# Patient Record
Sex: Male | Born: 1995
Health system: Southern US, Community
[De-identification: ages and names within clinical notes are randomized; demographics above are authoritative.]

## PROBLEM LIST (undated history)

## (undated) DIAGNOSIS — R569 Unspecified convulsions: Secondary | ICD-10-CM

## (undated) DIAGNOSIS — Z789 Other specified health status: Secondary | ICD-10-CM

## (undated) DIAGNOSIS — Z720 Tobacco use: Secondary | ICD-10-CM

## (undated) DIAGNOSIS — F419 Anxiety disorder, unspecified: Secondary | ICD-10-CM

## (undated) HISTORY — DX: Unspecified convulsions: R56.9

## (undated) HISTORY — DX: Anxiety disorder, unspecified: F41.9

## (undated) HISTORY — DX: Other specified health status: Z78.9

## (undated) HISTORY — DX: Tobacco use: Z72.0

---
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
## 2018-04-03 ENCOUNTER — Encounter: Payer: BLUE CROSS/BLUE SHIELD | Admitting: Family Medicine

## 2018-05-15 ENCOUNTER — Encounter: Payer: BLUE CROSS/BLUE SHIELD | Admitting: Family Medicine

## 2018-06-08 ENCOUNTER — Other Ambulatory Visit: Payer: Self-pay

## 2018-06-08 ENCOUNTER — Encounter (HOSPITAL_COMMUNITY): Payer: Self-pay

## 2018-06-08 ENCOUNTER — Emergency Department (HOSPITAL_COMMUNITY)
Admission: EM | Admit: 2018-06-08 | Discharge: 2018-06-08 | Disposition: A | Discharge status: Home / Self Care | Payer: 59 | Attending: Emergency Medicine | Admitting: Emergency Medicine

## 2018-06-08 DIAGNOSIS — R55 Syncope and collapse: Secondary | ICD-10-CM | POA: Insufficient documentation

## 2018-06-08 DIAGNOSIS — F17290 Nicotine dependence, other tobacco product, uncomplicated: Secondary | ICD-10-CM | POA: Diagnosis not present

## 2018-06-08 LAB — BASIC METABOLIC PANEL
Anion gap: 11 (ref 5–15)
BUN: 8 mg/dL (ref 6–20)
CO2: 25 mmol/L (ref 22–32)
Calcium: 9.6 mg/dL (ref 8.9–10.3)
Chloride: 103 mmol/L (ref 98–111)
Creatinine, Ser: 1.15 mg/dL (ref 0.61–1.24)
GFR calc Af Amer: >60 mL/min (ref >60)
GFR calc non Af Amer: >60 mL/min (ref >60)
Glucose, Bld: 111 mg/dL — ABNORMAL HIGH (ref 70–99)
Potassium: 3.2 mmol/L — ABNORMAL LOW (ref 3.5–5.1)
Sodium: 139 mmol/L (ref 135–145)

## 2018-06-08 LAB — URINALYSIS, ROUTINE W REFLEX MICROSCOPIC
Bacteria, UA: NONE SEEN
Bilirubin Urine: NEGATIVE
Glucose, UA: NEGATIVE mg/dL
Hgb urine dipstick: NEGATIVE
Ketones, ur: NEGATIVE mg/dL
Leukocytes,Ua: NEGATIVE
Nitrite: NEGATIVE
Protein, ur: 30 mg/dL — AB
Specific Gravity, Urine: 1.025 (ref 1.005–1.030)
pH: 5 (ref 5.0–8.0)

## 2018-06-08 LAB — CBC
HCT: 44 % (ref 39.0–52.0)
Hemoglobin: 14.9 g/dL (ref 13.0–17.0)
MCH: 29.5 pg (ref 26.0–34.0)
MCHC: 33.9 g/dL (ref 30.0–36.0)
MCV: 87.1 fL (ref 80.0–100.0)
Platelets: 227 10*3/uL (ref 150–400)
RBC: 5.05 MIL/uL (ref 4.22–5.81)
RDW: 12.8 % (ref 11.5–15.5)
WBC: 9.5 10*3/uL (ref 4.0–10.5)
nRBC: 0 % (ref 0.0–0.2)

## 2018-06-08 MED ORDER — SODIUM CHLORIDE 0.9% FLUSH: 3.0000 mL | Freq: Once | INTRAVENOUS | Status: DC

## 2018-06-08 NOTE — Discharge Instructions (Addendum)
Please return to the emergency department with any new or worsening symptoms.

## 2018-06-08 NOTE — ED Triage Notes (Signed)
Pt comes via [facility] was at work and had syncopal episode for a few seconds, denies n/v, pt felt better after he ate a snickers.

## 2018-06-08 NOTE — ED Provider Notes (Signed)
[facility] Provider Note   CSN: [id number] Arrival date & time: 06/08/18  0000    History   Chief Complaint Chief Complaint  Patient presents with  . Loss of Consciousness    HPI Cole Thompson is a 23 y.o. male.     Patient to ED after brief syncopal episode that occurred this evening while at work. He had not eaten in a while and felt hungry. Before he could get something to eat, he felt weak and passed out for approximately 10-15 seconds. He report similar occurrences twice in the last 6 months where he will pass out when he becomes too hungry or hasn't eaten. No pre-syncopal chest pain, SOB, or dizziness. No seizure activity. He is supposed to be undergoing glucose tolerance testing that has been delayed due to the coronavirus pandemic. He feels he is back to his baseline now.   The history is provided by the patient. No language interpreter was used.  Loss of Consciousness  Associated symptoms: no fever     Past Medical History:  Diagnosis Date  . Nicotine vapor product user    rare nicotine use.  . Seizures (HCC)    as an infant; pt was born premature.  No probs after infancy.    There are no active problems to display for this patient.   History reviewed. No pertinent surgical history.      Home Medications    Prior to Admission medications   Not on File    Family History Family History  Problem Relation Age of Onset  . Hypertension Mother     Social History Social History   Tobacco Use  . Smoking status: Current Every Day Smoker    Types: E-cigarettes  . Smokeless tobacco: Never Used  . Tobacco comment: Vapes  Substance Use Topics  . Alcohol use: No  . Drug use: No     Allergies   Patient has no known allergies.   Review of Systems Review of Systems  Constitutional: Negative for chills and fever.  HENT: Negative.   Respiratory: Negative.   Cardiovascular: Positive for syncope.   Gastrointestinal: Negative.   Musculoskeletal: Negative.   Skin: Negative.   Neurological: Positive for syncope.     Physical Exam Updated Vital Signs BP 123/77 (BP Location: Right Arm)   Pulse 66   Temp 98.4 F (36.9 C) (Oral)   Resp 19   Ht 5\' 11"  (1.803 m)   Wt 71.7 kg   SpO2 100%   BMI 22.04 kg/m   Physical Exam Vitals signs and nursing note reviewed.  Constitutional:      Appearance: He is well-developed.  HENT:     Head: Normocephalic and atraumatic.  Eyes:     Pupils: Pupils are equal, round, and reactive to light.  Neck:     Musculoskeletal: Normal range of motion and neck supple.  Cardiovascular:     Rate and Rhythm: Normal rate and regular rhythm.     Heart sounds: No murmur.  Pulmonary:     Effort: Pulmonary effort is normal.     Breath sounds: Normal breath sounds. No rhonchi or rales.  Abdominal:     General: Bowel sounds are normal.     Palpations: Abdomen is soft.     Tenderness: There is no abdominal tenderness. There is no guarding or rebound.  Musculoskeletal: Normal range of motion.  Skin:    General: Skin is warm and dry.     Findings: No  rash.  Neurological:     General: No focal deficit present.     Mental Status: He is alert and oriented to person, place, and time.     Sensory: No sensory deficit.     Motor: No weakness.     Coordination: Coordination normal.      ED Treatments / Results  Labs (all labs ordered are listed, but only abnormal results are displayed) Labs Reviewed  BASIC METABOLIC PANEL - Abnormal; Notable for the following components:      Result Value   Potassium 3.2 (*)    Glucose, Bld 111 (*)    All other components within normal limits  URINALYSIS, ROUTINE W REFLEX MICROSCOPIC - Abnormal; Notable for the following components:   APPearance HAZY (*)    Protein, ur 30 (*)    All other components within normal limits  CBC  CBG MONITORING, ED   Results for orders placed or performed during the hospital  encounter of 06/08/18  Basic metabolic panel  Result Value Ref Range   Sodium 139 135 - 145 mmol/L   Potassium 3.2 (L) 3.5 - 5.1 mmol/L   Chloride 103 98 - 111 mmol/L   CO2 25 22 - 32 mmol/L   Glucose, Bld 111 (H) 70 - 99 mg/dL   BUN 8 6 - 20 mg/dL   Creatinine, Ser 1.63 0.61 - 1.24 mg/dL   Calcium 9.6 8.9 - 84.6 mg/dL   GFR calc non Af Amer >60 >60 mL/min   GFR calc Af Amer >60 >60 mL/min   Anion gap 11 5 - 15  CBC  Result Value Ref Range   WBC 9.5 4.0 - 10.5 K/uL   RBC 5.05 4.22 - 5.81 MIL/uL   Hemoglobin 14.9 13.0 - 17.0 g/dL   HCT 65.9 93.5 - 70.1 %   MCV 87.1 80.0 - 100.0 fL   MCH 29.5 26.0 - 34.0 pg   MCHC 33.9 30.0 - 36.0 g/dL   RDW 77.9 39.0 - 30.0 %   Platelets 227 150 - 400 K/uL   nRBC 0.0 0.0 - 0.2 %  Urinalysis, Routine w reflex microscopic  Result Value Ref Range   Color, Urine YELLOW YELLOW   APPearance HAZY (A) CLEAR   Specific Gravity, Urine 1.025 1.005 - 1.030   pH 5.0 5.0 - 8.0   Glucose, UA NEGATIVE NEGATIVE mg/dL   Hgb urine dipstick NEGATIVE NEGATIVE   Bilirubin Urine NEGATIVE NEGATIVE   Ketones, ur NEGATIVE NEGATIVE mg/dL   Protein, ur 30 (A) NEGATIVE mg/dL   Nitrite NEGATIVE NEGATIVE   Leukocytes,Ua NEGATIVE NEGATIVE   RBC / HPF 0-5 0 - 5 RBC/hpf   WBC, UA 0-5 0 - 5 WBC/hpf   Bacteria, UA NONE SEEN NONE SEEN   Mucus PRESENT      EKG EKG Interpretation  Date/Time:  Thursday June 08 2018 00:07:33 EDT Ventricular Rate:  76 PR Interval:  118 QRS Duration: 98 QT Interval:  362 QTC Calculation: 407 R Axis:   87 Text Interpretation:  Normal sinus rhythm Normal ECG STE likely BRP Confirmed by Marily Memos [phone]) on 06/08/2018 4:48:38 AM   Radiology No results found.  Procedures Procedures (including critical care time)  Medications Ordered in ED Medications  sodium chloride flush (NS) 0.9 % injection 3 mL (has no administration in time range)     Initial Impression / Assessment and Plan / ED Course  I have reviewed the triage  vital signs and the nursing notes.  Pertinent labs & imaging  results that were available during my care of the patient were reviewed by me and considered in my medical decision making (see chart for details).        The patient is here for evaluation of syncopal episode. He reports similar events in the past when he has not been able to eat anything. Planned glucose tolerance testing delayed due to the current pandemic.   No fever, cough, SOB so suggest infection. No tachycardia, hypoxia or risk factors for PE. Doubt cardiogenic syncope given NSR EKG. Current and past history of syncopal episodes are c/w hypoglycemic events.   He is well appearing. He has not complaints. EKG is a NSR. Labs are remarkable only for a mildly depleted potassium of 3.2.   He is felt appropriate for discharge home. He is encouraged to pursue planned outpatient follow up.  Final Clinical Impressions(s) / ED Diagnoses   Final diagnoses:  None   1. Syncope   ED Discharge Orders    None       Elpidio Anis, Cordelia Poche 06/08/18 1308    Mesner, Barbara Cower, MD 06/08/18 [phone]

## 2018-06-23 ENCOUNTER — Other Ambulatory Visit: Payer: Self-pay

## 2018-06-26 ENCOUNTER — Ambulatory Visit (INDEPENDENT_AMBULATORY_CARE_PROVIDER_SITE_OTHER): Payer: 59 | Admitting: Family Medicine

## 2018-06-26 ENCOUNTER — Other Ambulatory Visit: Payer: Self-pay

## 2018-06-26 ENCOUNTER — Encounter: Payer: Self-pay | Admitting: Family Medicine

## 2018-06-26 VITALS — BP 105/63 | HR 67 | Temp 98.0°F | Resp 16 | Ht 70.0 in | Wt 165.6 lb

## 2018-06-26 DIAGNOSIS — R634 Abnormal weight loss: Secondary | ICD-10-CM | POA: Diagnosis not present

## 2018-06-26 DIAGNOSIS — R55 Syncope and collapse: Secondary | ICD-10-CM

## 2018-06-26 DIAGNOSIS — T730XXS Starvation, sequela: Secondary | ICD-10-CM | POA: Diagnosis not present

## 2018-06-26 DIAGNOSIS — E876 Hypokalemia: Secondary | ICD-10-CM

## 2018-06-26 NOTE — Progress Notes (Addendum)
Office Note 06/26/2018  CC:  Chief Complaint  Patient presents with  . Hospitalization Follow-up    HPI:  Cole Thompson is a 23 y.o.  male who is here for ED f/u from [facility] ED visit on 06/08/18. I last saw him (for his "establish care" visit here) 04/14/15.  I reviewed his ED records today.   EKG: NORMAL Labs showed K 3.2, glucose 111, otherwise normal met panel.  Also normal CBC.  UA normal except  No imaging.  No meds administered.  He works for [employer], fueling planes, etc.   Not in college currently.  When working on the evening of 06/08/18, he got acute onset of generalized weakness, started shaking, felt like he was going to pass out so was going to go back inside to get something to eat but totally lost consciousness before he could.  Unconscious about 30 sec.  No injury sustained.  Initially groggy and disoriented, then was given candy bar and after 30 sec or so he felt totally back to normal.  No loss of b/b.  No convulsive movements.  No n/v. No prior illness.  It had been about 3-4 hours since he had last eaten.  He usually eats every 2-3 hours.  He has never actually documented any low glucose (or any other glucose measurement for that matter).  This all became a problem around jan 2020 AFTER he had a bad GI illness with dehydration that caused syncope.  No palpitations.  Last 3 yrs he has lost about 15 lbs, has started to gain some back lately. This is not purposeful wt loss: he has not been dieting or exercising.  No diarrhea or vomiting. No drugs.  Occ alcohol use only.  No FH of any similar problem of abnormal wt loss or hypoglycemic spells. Shaky, blurry vision, very empty-feeling stomach usually comes about 2 hours after eating.  This often happens in the middle of the night as well. He eats a good variety of foods.  Past Medical History:  Diagnosis Date  . Nicotine vapor product user    rare nicotine use.  . Seizures ([place])    as an  infant; pt was born premature.  No probs after infancy.    History reviewed. No pertinent surgical history.  Family History  Problem Relation Age of Onset  . Hypertension Mother     Social History   Socioeconomic History  . Marital status: Single    Spouse name: Not on file  . Number of children: Not on file  . Years of education: Not on file  . Highest education level: Not on file  Occupational History  . Not on file  Social Needs  . Financial resource strain: Not on file  . Food insecurity    Worry: Not on file    Inability: Not on file  . Transportation needs    Medical: Not on file    Non-medical: Not on file  Tobacco Use  . Smoking status: Current Some Day Smoker    Types: E-cigarettes  . Smokeless tobacco: Never Used  . Tobacco comment: Vapes  Substance and Sexual Activity  . Alcohol use: No  . Drug use: No  . Sexual activity: Not on file  Lifestyle  . Physical activity    Days per week: Not on file    Minutes per session: Not on file  . Stress: Not on file  Relationships  . Social connections    Talks on phone: Not on file  Gets together: Not on file    Attends religious service: Not on file    Active member of club or organization: Not on file    Attends meetings of clubs or organizations: Not on file    Relationship status: Not on file  . Intimate partner violence    Fear of current or ex partner: Not on file    Emotionally abused: Not on file    Physically abused: Not on file    Forced sexual activity: Not on file  Other Topics Concern  . Not on file  Social History Narrative   Senior at [employer].   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   No alcohol.  No drugs.    Outpatient Medications Prior to Visit  Medication Sig Dispense Refill  . POTASSIUM GLUCONATE PO Take 15 mg by mouth as needed. Take 1 tablet as needed for abdominal cramps.     No facility-administered medications prior to visit.     No Known  Allergies  ROS Review of Systems  Constitutional: Negative for appetite change, chills, fatigue and fever.  HENT: Negative for congestion, dental problem, ear pain and sore throat.   Eyes: Negative for discharge, redness and visual disturbance.  Respiratory: Negative for cough, chest tightness, shortness of breath and wheezing.   Cardiovascular: Negative for chest pain, palpitations and leg swelling.  Gastrointestinal: Negative for abdominal pain, blood in stool, diarrhea, nausea and vomiting.  Genitourinary: Negative for difficulty urinating, dysuria, flank pain, frequency, hematuria and urgency.  Musculoskeletal: Negative for arthralgias, back pain, joint swelling, myalgias and neck stiffness.  Skin: Negative for pallor and rash.  Neurological: Negative for dizziness, speech difficulty, weakness and headaches.  Hematological: Negative for adenopathy. Does not bruise/bleed easily.  Psychiatric/Behavioral: Negative for confusion and sleep disturbance. The patient is not nervous/anxious.     PE; Blood pressure 105/63, pulse 67, temperature 98 F (36.7 C), temperature source Temporal, resp. rate 16, height _0  (1.778 m), weight 165 lb 9.6 oz (75.1 kg), SpO2 97 %. Body mass index is 23.76 kg/m.  Gen: Alert, well appearing.  Patient is oriented to person, place, time, and situation. AFFECT: pleasant, lucid thought and speech. ENT: Ears: EACs clear, normal epithelium.  TMs with good light reflex and landmarks bilaterally.  Eyes: no injection, icteris, swelling, or exudate.  EOMI, PERRLA. Nose: no drainage or turbinate edema/swelling.  No injection or focal lesion.  Mouth: lips without lesion/swelling.  Oral mucosa pink and moist.  Dentition intact and without obvious caries or gingival swelling.  Oropharynx without erythema, exudate, or swelling.  Neck: supple/nontender.  No LAD, mass, or TM.  Carotid pulses 2+ bilaterally, without bruits. CV: RRR, no m/r/g.   LUNGS: CTA bilat, nonlabored  resps, good aeration in all lung fields. ABD: soft, NT, ND, BS normal.  No hepatospenomegaly or mass.  No bruits. EXT: no clubbing, cyanosis, or edema.  Musculoskeletal: no joint swelling, erythema, warmth, or tenderness.  ROM of all joints intact. Skin - no sores or suspicious lesions or rashes or color changes   Pertinent labs:  No results found for: TSH Lab Results  Component Value Date   WBC 9.5 06/08/2018   HGB 14.9 06/08/2018   HCT 44.0 06/08/2018   MCV 87.1 06/08/2018   PLT 227 06/08/2018   Lab Results  Component Value Date   CREATININE 1.15 06/08/2018   BUN 8 06/08/2018   NA 139 06/08/2018   K 3.2 (L) 06/08/2018   CL 103 06/08/2018  CO2 25 06/08/2018   No results found for: HGBA1C  UA: 30 mg/dl protein, o/w normal (SG borderline high at 1.025).   ASSESSMENT AND PLAN:   New pt/re-establishing.  1) Syncope, possibly hypoglycemia-induced. Will ask endocrinologist to see him. I recommended he continue to eat q2-3 hours to prevent sx's. Repeat potassium to f/u mild hypokalemia found in ED visit. Check mag level as well.  2) Abnl weight loss: unclear.  The patient reports this, but review of wt's in EMR he is not far off from the 164 lbs he weighed on 04/2015.  An After Visit Summary was printed and given to the patient.  FOLLOW UP:  Return in about 1 year (around 06/26/2019) for annual CPE (fasting).  Signed:  Crissie Sickles, MD           06/26/2018

## 2018-06-27 LAB — MAGNESIUM: Magnesium: 2.2 mg/dL (ref 1.5–2.5)

## 2018-06-27 LAB — BASIC METABOLIC PANEL
BUN: 14 mg/dL (ref 7–25)
CO2: 29 mmol/L (ref 20–32)
Calcium: 9.6 mg/dL (ref 8.6–10.3)
Chloride: 105 mmol/L (ref 98–110)
Creat: 1.01 mg/dL (ref 0.60–1.35)
Glucose, Bld: 85 mg/dL (ref 65–99)
Potassium: 4.4 mmol/L (ref 3.5–5.3)
Sodium: 140 mmol/L (ref 135–146)

## 2018-06-27 LAB — TSH: TSH: 1.19 mIU/L (ref 0.40–4.50)

## 2018-06-27 LAB — HIV ANTIBODY (ROUTINE TESTING W REFLEX): HIV 1&2 Ab, 4th Generation: NONREACTIVE

## 2018-06-27 LAB — SEDIMENTATION RATE: Sed Rate: 2 mm/h (ref 0–15)

## 2018-07-26 ENCOUNTER — Encounter: Payer: BLUE CROSS/BLUE SHIELD | Admitting: Family Medicine

## 2018-10-05 HISTORY — PX: WRIST SURGERY: SHX841

## 2018-11-10 ENCOUNTER — Other Ambulatory Visit: Payer: Self-pay

## 2018-11-10 ENCOUNTER — Encounter: Payer: Self-pay | Admitting: Family Medicine

## 2018-11-10 ENCOUNTER — Ambulatory Visit: Payer: 59 | Admitting: Family Medicine

## 2018-11-10 VITALS — BP 110/70 | HR 80 | Resp 16 | Ht 70.0 in | Wt 151.0 lb

## 2018-11-10 DIAGNOSIS — F5105 Insomnia due to other mental disorder: Secondary | ICD-10-CM | POA: Diagnosis not present

## 2018-11-10 DIAGNOSIS — F332 Major depressive disorder, recurrent severe without psychotic features: Secondary | ICD-10-CM | POA: Diagnosis not present

## 2018-11-10 DIAGNOSIS — F99 Mental disorder, not otherwise specified: Secondary | ICD-10-CM

## 2018-11-10 DIAGNOSIS — F4310 Post-traumatic stress disorder, unspecified: Secondary | ICD-10-CM

## 2018-11-10 DIAGNOSIS — F411 Generalized anxiety disorder: Secondary | ICD-10-CM

## 2018-11-10 MED ORDER — DULOXETINE HCL 30 MG PO CPEP: 30.0000 mg | ORAL_CAPSULE | Freq: Every day | ORAL | 0 refills | Status: DC
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
## 2018-11-17 ENCOUNTER — Other Ambulatory Visit: Payer: Self-pay

## 2018-11-17 ENCOUNTER — Ambulatory Visit (INDEPENDENT_AMBULATORY_CARE_PROVIDER_SITE_OTHER): Payer: 59 | Admitting: Family Medicine

## 2018-11-17 ENCOUNTER — Encounter: Payer: Self-pay | Admitting: Family Medicine

## 2018-11-17 VITALS — BP 155/78 | HR 80 | Temp 98.5°F | Resp 16 | Ht 70.0 in | Wt 155.0 lb

## 2018-11-17 DIAGNOSIS — F3341 Major depressive disorder, recurrent, in partial remission: Secondary | ICD-10-CM

## 2018-11-17 MED ORDER — DULOXETINE HCL 30 MG PO CPEP: 30.0000 mg | ORAL_CAPSULE | Freq: Every day | ORAL | 0 refills | Status: DC

## 2018-11-17 NOTE — Progress Notes (Signed)
OFFICE VISIT  11/17/2018   CC:  Chief Complaint  Patient presents with  . Follow-up    depression   HPI:    Patient is a 23 y.o. Caucasian male who presents for 1 week f/u: MDD, recurrent, w/out psychosis.  No plans or intention to hurt/kill himself or others. PTSD. Marijuana abuse. 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 GAD.  I started him on cymbalta 30mg  qd 1 wk ago and he had just started counseling at that time as well.  Interim hx: Doing  Better. Sleeping better, appetite is back, more motivation.   No hypomania or mania.   No side effects from the med have been noted. His counselor calls him to check in about every other day.   Past Medical History:  Diagnosis Date  . Nicotine vapor product user    rare nicotine use.  . Seizures ([place])    as an infant; pt was born premature.  No probs after infancy.    History reviewed. No pertinent surgical history.  Outpatient Medications Prior to Visit  Medication Sig Dispense Refill  . DULoxetine (CYMBALTA) 30 MG capsule Take 1 capsule (30 mg total) by mouth daily. 30 capsule 0   No facility-administered medications prior to visit.     No Known Allergies  ROS As per HPI  PE: Initial bp w/automated cuff after patient rushed to office was 155/78.  Repeat at end of visit with manual cuff was 112/70 Blood pressure (!) 155/78, pulse 80, temperature 98.5 F (36.9 C), temperature source Temporal, resp. rate 16, height 5\' 10"  (1.778 m), weight 155 lb (70.3 kg), SpO2 98 %. Body mass index is 22.24 kg/m.  Wt Readings from Last 2 Encounters:  11/17/18 155 lb (70.3 kg)  11/10/18 151 lb (68.5 kg)    Gen: alert, oriented x 4, affect pleasant.  Lucid thinking and conversation noted. HEENT: PERRLA, EOMI.   Neck: no LAD, mass, or thyromegaly. CV: RRR, no m/r/g LUNGS: CTA bilat, nonlabored. NEURO: no tremor or tics noted on observation.  Coordination intact. CN 2-12 grossly intact bilaterally, strength 5/5 in all  extremeties.  No ataxia.   LABS:  none  IMPRESSION AND PLAN:  MDD, recurrent, partial remission on just 1 wk of cymbalta. Suspect at least mild placebo effect. No sign of hypomania or mania. Continue cymbalta 30mg  qd and continue counseling. Signs/symptoms to call or return for were reviewed and pt expressed understanding.  An After Visit Summary was printed and given to the patient.  FOLLOW UP: Return in about 4 weeks (around 12/15/2018) for f/u depression.  Signed:  Crissie Sickles, MD           11/17/2018

## 2018-12-15 ENCOUNTER — Ambulatory Visit: Payer: 59 | Admitting: Family Medicine

## 2018-12-20 ENCOUNTER — Ambulatory Visit: Payer: 59 | Admitting: Family Medicine

## 2018-12-20 NOTE — Progress Notes (Deleted)
OFFICE VISIT  12/20/2018   CC: No chief complaint on file.  HPI:    Patient is a 23 y.o. Caucasian male who presents for 4 wk f/u recurrent MDD. A/P as of last visit: "MDD, recurrent, partial remission on just 1 wk of cymbalta. Suspect at least mild placebo effect. No sign of hypomania or mania. Continue cymbalta 30mg  qd and continue counseling."  Interim hx: ***  Past Medical History:  Diagnosis Date  . Nicotine vapor product user    rare nicotine use.  . Seizures ([place])    as an infant; pt was born premature.  No probs after infancy.    No past surgical history on file.  Outpatient Medications Prior to Visit  Medication Sig Dispense Refill  . DULoxetine (CYMBALTA) 30 MG capsule Take 1 capsule (30 mg total) by mouth daily. 30 capsule 0   No facility-administered medications prior to visit.    No Known Allergies  ROS As per HPI  PE: There were no vitals taken for this visit. ***  LABS:  Lab Results  Component Value Date   TSH 1.19 06/26/2018   Lab Results  Component Value Date   WBC 9.5 06/08/2018   HGB 14.9 06/08/2018   HCT 44.0 06/08/2018   MCV 87.1 06/08/2018   PLT 227 06/08/2018   Lab Results  Component Value Date   CREATININE 1.01 06/26/2018   BUN 14 06/26/2018   NA 140 06/26/2018   K 4.4 06/26/2018   CL 105 06/26/2018   CO2 29 06/26/2018    IMPRESSION AND PLAN:  No problem-specific Assessment & Plan notes found for this encounter.   An After Visit Summary was printed and given to the patient.  FOLLOW UP: No follow-ups on file.  Signed:  Crissie Sickles, MD           12/20/2018

## 2019-08-17 ENCOUNTER — Encounter: Payer: Self-pay | Admitting: Family Medicine

## 2019-08-17 ENCOUNTER — Other Ambulatory Visit: Payer: Self-pay

## 2019-08-17 ENCOUNTER — Ambulatory Visit: Payer: Self-pay | Admitting: Family Medicine

## 2019-08-17 VITALS — BP 106/69 | HR 82 | Temp 97.8°F | Resp 16 | Ht 70.0 in | Wt 157.8 lb

## 2019-08-17 DIAGNOSIS — S069X0A Unspecified intracranial injury without loss of consciousness, initial encounter: Secondary | ICD-10-CM
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
## 2019-09-03 ENCOUNTER — Telehealth: Payer: Self-pay

## 2019-09-03 NOTE — Telephone Encounter (Signed)
This med will not work right away to help his anxiety and panic, so I would prefer to see him for his appt 9/1 prior to starting a med and we'll see what would be the best for him at that time.

## 2019-09-03 NOTE — Telephone Encounter (Signed)
DPR on file "okay to speak with mother, Silvio Pate"    Unable to reach patient's mother.  Patient stated on 08/17/19 that he was not taking cymbalta.  LMOM stating he has not had refill since 11/17/2018.  Asked patient's mother to CB to get more information since we have no rx'd this med since 11/17/2018.

## 2019-09-03 NOTE — Telephone Encounter (Signed)
Mother called regarding refill of meds for patient.    Please call [phone].   DULoxetine (CYMBALTA) 30 MG capsule    [facility]

## 2019-09-03 NOTE — Telephone Encounter (Signed)
Patient advised of recommendations.  
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
## 2019-09-05 ENCOUNTER — Ambulatory Visit: Payer: Self-pay | Admitting: Family Medicine

## 2019-09-05 ENCOUNTER — Other Ambulatory Visit: Payer: Self-pay

## 2019-09-05 ENCOUNTER — Encounter: Payer: Self-pay | Admitting: Family Medicine

## 2019-09-05 VITALS — BP 123/75 | HR 70 | Temp 97.9°F | Resp 16 | Ht 70.0 in | Wt 157.0 lb

## 2019-09-05 DIAGNOSIS — F410 Panic disorder [episodic paroxysmal anxiety] without agoraphobia: Secondary | ICD-10-CM

## 2019-09-05 MED ORDER — PAROXETINE HCL 10 MG PO TABS: ORAL_TABLET | ORAL | 0 refills | Status: DC
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
## 2019-10-03 ENCOUNTER — Ambulatory Visit (INDEPENDENT_AMBULATORY_CARE_PROVIDER_SITE_OTHER): Payer: Self-pay | Admitting: Family Medicine

## 2019-10-03 DIAGNOSIS — Z0289 Encounter for other administrative examinations: Secondary | ICD-10-CM

## 2019-10-03 NOTE — Progress Notes (Deleted)
OFFICE VISIT  10/03/2019  CC: No chief complaint on file.   HPI:    Patient is a 24 y.o. male who presents for 1 mo f/u panic disorder. A/P as of last visit: "Panic disorder: a bit complicated by recent hx of closed head injury/concussion. He seems to be pretty much completely over his concussion. Discussed dx of panic disorder. Start paxil 10mg  qd x 7d, then increase to 20mg  qd.  We can further up-titrate when I see him back in 1 mo if necessary."  INTERIM HX: ***  Past Medical History:  Diagnosis Date  . Nicotine vapor product user    rare nicotine use.  . Seizures ([facility])    as an infant; pt was born premature.  No probs after infancy.    No past surgical history on file.  Outpatient Medications Prior to Visit  Medication Sig Dispense Refill  . PARoxetine (PAXIL) 10 MG tablet 1 tab po qd x 7d, then increase to 2 tabs po qd 53 tablet 0   No facility-administered medications prior to visit.    No Known Allergies  ROS As per HPI  PE: Vitals with BMI 09/05/2019 08/17/2019 11/17/2018  Height 5\' 10"  5\' 10"  5\' 10"   Weight 157 lbs 157 lbs 13 oz 155 lbs  BMI 22.53 22.64 22.24  Systolic 123 106 08/19/2019  Diastolic 75 69 78  Pulse 70 82 80     ***  LABS:    Chemistry      Component Value Date/Time   NA 140 06/26/2018 1422   K 4.4 06/26/2018 1422   CL 105 06/26/2018 1422   CO2 29 06/26/2018 1422   BUN 14 06/26/2018 1422   CREATININE 1.01 06/26/2018 1422      Component Value Date/Time   CALCIUM 9.6 06/26/2018 1422       IMPRESSION AND PLAN:  No problem-specific Assessment & Plan notes found for this encounter.   An After Visit Summary was printed and given to the patient.  FOLLOW UP: No follow-ups on file.  Signed:  06/28/2018, MD           10/03/2019

## 2019-11-21 ENCOUNTER — Telehealth: Payer: Self-pay

## 2019-11-21 NOTE — Telephone Encounter (Signed)
Calling about $50 no show charge.  Patient states he cancelled this appt.  He said he has new job and is unable to miss any time in the first 90 days.  Patient contact # [phone].

## 2019-11-22 NOTE — Telephone Encounter (Signed)
Okay to waive charge per Harmon Pier.  Sent email to charge correction 11/22/19.  Patient aware of fee being waived.

## 2020-02-27 ENCOUNTER — Encounter (HOSPITAL_COMMUNITY): Payer: Self-pay | Admitting: Emergency Medicine

## 2020-02-27 ENCOUNTER — Ambulatory Visit (INDEPENDENT_AMBULATORY_CARE_PROVIDER_SITE_OTHER): Payer: Self-pay

## 2020-02-27 ENCOUNTER — Ambulatory Visit (HOSPITAL_COMMUNITY)
Admission: EM | Admit: 2020-02-27 | Discharge: 2020-02-27 | Disposition: A | Discharge status: Home / Self Care | Payer: Self-pay | Attending: Urgent Care | Admitting: Urgent Care

## 2020-02-27 ENCOUNTER — Other Ambulatory Visit: Payer: Self-pay

## 2020-02-27 DIAGNOSIS — M25532 Pain in left wrist: Secondary | ICD-10-CM

## 2020-02-27 DIAGNOSIS — S52612A Displaced fracture of left ulna styloid process, initial encounter for closed fracture: Secondary | ICD-10-CM

## 2020-02-27 DIAGNOSIS — S6992XA Unspecified injury of left wrist, hand and finger(s), initial encounter: Secondary | ICD-10-CM

## 2020-02-27 MED ORDER — IBUPROFEN 800 MG PO TABS
800.0000 mg | ORAL_TABLET | Freq: Once | ORAL | Status: AC
  Administered 2020-02-27: 800 mg via ORAL

## 2020-02-27 MED ORDER — IBUPROFEN 800 MG PO TABS
ORAL_TABLET | ORAL | Status: AC
  Filled 2020-02-27: qty 1

## 2020-02-27 MED ORDER — HYDROCODONE-ACETAMINOPHEN 5-325 MG PO TABS: 1.0000 | ORAL_TABLET | Freq: Four times a day (QID) | ORAL | 0 refills | Status: DC | PRN

## 2020-02-27 MED ORDER — NAPROXEN 500 MG PO TABS: 500.0000 mg | ORAL_TABLET | Freq: Two times a day (BID) | ORAL | 0 refills | Status: DC

## 2020-02-27 NOTE — ED Notes (Signed)
oroth tech called for splint

## 2020-02-27 NOTE — ED Triage Notes (Signed)
Pt presents with left hand pain. States hit hand on car today while working. States had surgery in same hand with pins and screws placed. States feels like something may be popped out of place.

## 2020-02-27 NOTE — Progress Notes (Signed)
Orthopedic Tech Progress Note Patient Details:  Cole Thompson May 28, 1995 [id number]  Ortho Devices Type of Ortho Device: Sugartong splint Ortho Device/Splint Location: LUE Ortho Device/Splint Interventions: Application,Ordered   Post Interventions Patient Tolerated: Well   Kyra A Tye 02/27/2020, 4:13 PM

## 2020-02-27 NOTE — Discharge Instructions (Addendum)
Make sure you follow-up with Dr. Duwayne Heck for consultation regarding the fracture you suffered.  In the meantime, make sure that you wear the splint.  Please schedule naproxen twice daily with food for your severe pain.  If you still have pain despite taking naproxen regularly, this is breakthrough pain.  You can use hydrocodone, a narcotic pain medicine, once every 4-6 hours for this.  Once your pain is better controlled, switch back to just naproxen.

## 2020-02-27 NOTE — ED Provider Notes (Signed)
[facility]   MRN: [id number] DOB: 04-03-1995  Subjective:   Cole Thompson is a 25 y.o. male presenting for suffering a left wrist injury while at work today.  Patient states that he was working with machinery and it popped back and hit his thumb bending it in a very awkward way and turning his wrist some as well.  He has since had progressively worsening 10 out of 10 pain of the left wrist and difficulty bending at his left first MCP.  Reports that he is able to bend the thumb and still has a strength and sensation but the pain is severe and wants to make sure that he has not suffered any more damage.  Does have a history of left for surgery.  No current facility-administered medications for this encounter.  Current Outpatient Medications:  .  PARoxetine (PAXIL) 10 MG tablet, 1 tab po qd x 7d, then increase to 2 tabs po qd, Disp: 53 tablet, Rfl: 0   No Known Allergies  Past Medical History:  Diagnosis Date  . Nicotine vapor product user    rare nicotine use.  . Seizures ([facility])    as an infant; pt was born premature.  No probs after infancy.     History reviewed. No pertinent surgical history.  Family History  Problem Relation Age of Onset  . Hypertension Mother     Social History   Tobacco Use  . Smoking status: Current Some Day Smoker    Types: E-cigarettes  . Smokeless tobacco: Never Used  . Tobacco comment: Vapes  Substance Use Topics  . Alcohol use: No  . Drug use: No    ROS   Objective:   Vitals: BP 130/84 (BP Location: Right Arm)   Pulse 68   Temp 98.6 F (37 C) (Oral)   SpO2 100%   Physical Exam Constitutional:      General: He is not in acute distress.    Appearance: Normal appearance. He is well-developed and normal weight. He is not ill-appearing, toxic-appearing or diaphoretic.  HENT:     Head: Normocephalic and atraumatic.     Right Ear: External ear normal.     Left Ear: External ear normal.     Nose: Nose normal.      Mouth/Throat:     Pharynx: Oropharynx is clear.  Eyes:     General: No scleral icterus.       Right eye: No discharge.        Left eye: No discharge.     Extraocular Movements: Extraocular movements intact.     Pupils: Pupils are equal, round, and reactive to light.  Cardiovascular:     Rate and Rhythm: Normal rate.  Pulmonary:     Effort: Pulmonary effort is normal.  Musculoskeletal:     Left wrist: Tenderness, bony tenderness and snuff box tenderness present. No swelling, deformity, effusion, lacerations or crepitus. Decreased range of motion. Normal pulse.     Cervical back: Normal range of motion.     Comments: Left fingers have full range of motion, no ecchymosis, bony deformity.  Patient is able to flex and extend at the first DIP.  Neurological:     Mental Status: He is alert and oriented to person, place, and time.  Psychiatric:        Mood and Affect: Mood normal.        Behavior: Behavior normal.        Thought Content: Thought content normal.  Judgment: Judgment normal.     DG Wrist Complete Left  Result Date: 02/27/2020 CLINICAL DATA:  Left wrist pain, injury today EXAM: LEFT WRIST - COMPLETE 3+ VIEW COMPARISON:  None. FINDINGS: Frontal, oblique, lateral, and ulnar deviated views of the left wrist are obtained. Previous volar plate and screw fixation of the distal radius. There is a small acute fracture distal tip of the ulnar styloid, which is minimally distracted. No other acute bony abnormalities. Joint spaces are well preserved. Soft tissues are normal. IMPRESSION: 1. Small acute displaced ulnar styloid fracture. 2. Previous left radial ORIF. Electronically Signed   By: Sharlet Salina M.D.   On: 02/27/2020 15:32    Assessment and Plan :   I have reviewed the PDMP during this encounter.  1. Closed displaced fracture of styloid process of left ulna, initial encounter   2. Left wrist pain     Patient is to be placed in a sugar tong splint for the left wrist  to manage his ulnar styloid fracture.  Recommended scheduling naproxen and use hydrocodone for breakthrough pain.  Follow-up with Dr. Duwayne Heck for consultation on further management regarding his fracture. Counseled patient on potential for adverse effects with medications prescribed/recommended today, ER and return-to-clinic precautions discussed, patient verbalized understanding.    Wallis Bamberg, [place] 02/27/20 1545

## 2020-03-11 DIAGNOSIS — S62102A Fracture of unspecified carpal bone, left wrist, initial encounter for closed fracture: Secondary | ICD-10-CM | POA: Insufficient documentation

## 2021-02-23 ENCOUNTER — Other Ambulatory Visit: Payer: Self-pay

## 2021-02-24 ENCOUNTER — Encounter: Payer: Self-pay | Admitting: Family Medicine

## 2021-02-24 ENCOUNTER — Ambulatory Visit (INDEPENDENT_AMBULATORY_CARE_PROVIDER_SITE_OTHER): Payer: Self-pay | Admitting: Family Medicine

## 2021-02-24 VITALS — BP 111/66 | HR 84 | Temp 98.4°F | Ht 70.0 in | Wt 144.2 lb

## 2021-02-24 DIAGNOSIS — F411 Generalized anxiety disorder: Secondary | ICD-10-CM

## 2021-02-24 DIAGNOSIS — F4323 Adjustment disorder with mixed anxiety and depressed mood: Secondary | ICD-10-CM

## 2021-02-24 MED ORDER — PAROXETINE HCL 20 MG PO TABS: 20.0000 mg | ORAL_TABLET | Freq: Every day | ORAL | 0 refills | Status: AC

## 2021-02-24 NOTE — Progress Notes (Signed)
OFFICE VISIT  02/24/2021  CC:  Chief Complaint  Patient presents with   Follow-up   Patient is a 26 y.o. male who presents for anxiety and depression.   HPI: Cole Thompson describes a long history of excessive worry about many things.  Has trouble adjusting to new situations.  Most recently about a week ago he had a stormy break-up with his longtime girlfriend.  This has him frustrated, feeling down, sleeping poorly, no appetite, poor motivation, impaired concentration, and poor sleep.  He is still going to work daily.  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  He has purchased a home in [place]. Apparently his longtime girlfriend had psychiatric problems per patient report. Cole Thompson denies any suicidal or homicidal ideation. I saw him last in 2021 at which time he was struggling with anxiety and even had a significant panic attack. I started him on Paxil and he says he took this and it helped but he did not return for follow-up so he ran out of medication.  He does not abuse alcohol or drugs. PMP AWARE reviewed today: nothing is listed. No red flags.   Past Medical History:  Diagnosis Date   Anxiety and depression    Nicotine vapor product user    rare nicotine use.   Seizures ([facility])    as an infant; pt was born premature.  No probs after infancy.    Past Surgical History:  Procedure Laterality Date   WRIST SURGERY Left 10/2018    Outpatient Medications Prior to Visit  Medication Sig Dispense Refill   HYDROcodone-acetaminophen (NORCO/VICODIN) 5-325 MG tablet Take 1 tablet by mouth every 6 (six) hours as needed for severe pain. (Patient not taking: Reported on 02/24/2021) 15 tablet 0   naproxen (NAPROSYN) 500 MG tablet Take 1 tablet (500 mg total) by mouth 2 (two) times daily with a meal. (Patient not taking: Reported on 02/24/2021) 30 tablet 0   No facility-administered medications prior to visit.    No Known Allergies  ROS As per HPI  PE: Vitals with BMI 02/24/2021 02/27/2020  09/05/2019  Height 5\' 10"  - 5\' 10"   Weight 144 lbs 3 oz - 157 lbs  BMI 20.69 - 22.53  Systolic 111 130  Diastolic 66 84 75  Pulse 84 68 70   Physical Exam  Gen: Alert, well appearing.  Patient is oriented to person, place, time, and situation. AFFECT: pleasant, lucid thought and speech. No further exam today.  LABS:  Last CBC Lab Results  Component Value Date   WBC 9.5 06/08/2018   HGB 14.9 06/08/2018   HCT 44.0 06/08/2018   MCV 87.1 06/08/2018   MCH 29.5 06/08/2018   RDW 12.8 06/08/2018   PLT 227 06/08/2018   Last metabolic panel Lab Results  Component Value Date   GLUCOSE 85 06/26/2018   NA 140 06/26/2018   K 4.4 06/26/2018   CL 105 06/26/2018   CO2 29 06/26/2018   BUN 14 06/26/2018   CREATININE 1.01 06/26/2018   GFRNONAA >60 06/08/2018   CALCIUM 9.6 06/26/2018   ANIONGAP 11 06/08/2018   Last thyroid functions Lab Results  Component Value Date   TSH 1.19 06/26/2018   IMPRESSION AND PLAN:  GAD, with superimposed adjustment disorder with mixed anxiety and depressed mood. Every day since the break-up things seem to improve a little bit. I think he is actually recovering pretty well. Given the chronicity of his anxiety, though, and the fact that he found such good response to Paxil  in the past I will get him back on this medication at 20 mg a day. Additionally, I recommended he start seeing a counselor and he agreed.  Referral ordered today.  An After Visit Summary was printed and given to the patient.  FOLLOW UP: Return for 3-4 wks f/u anxiety/adjustment d/o.  Signed:  Santiago Bumpers, MD           02/24/2021

## 2021-03-18 ENCOUNTER — Other Ambulatory Visit: Payer: Self-pay | Admitting: Family Medicine

## 2021-03-30 ENCOUNTER — Ambulatory Visit: Payer: Self-pay | Admitting: Psychologist

## 2021-10-10 IMAGING — DX DG WRIST COMPLETE 3+V*L*
4 series · 4 of 4 positions shown · non-contrast
Comparison: None.

CLINICAL DATA: Left wrist pain, injury today

EXAM:
LEFT WRIST - COMPLETE 3+ VIEW

[wrist pa]
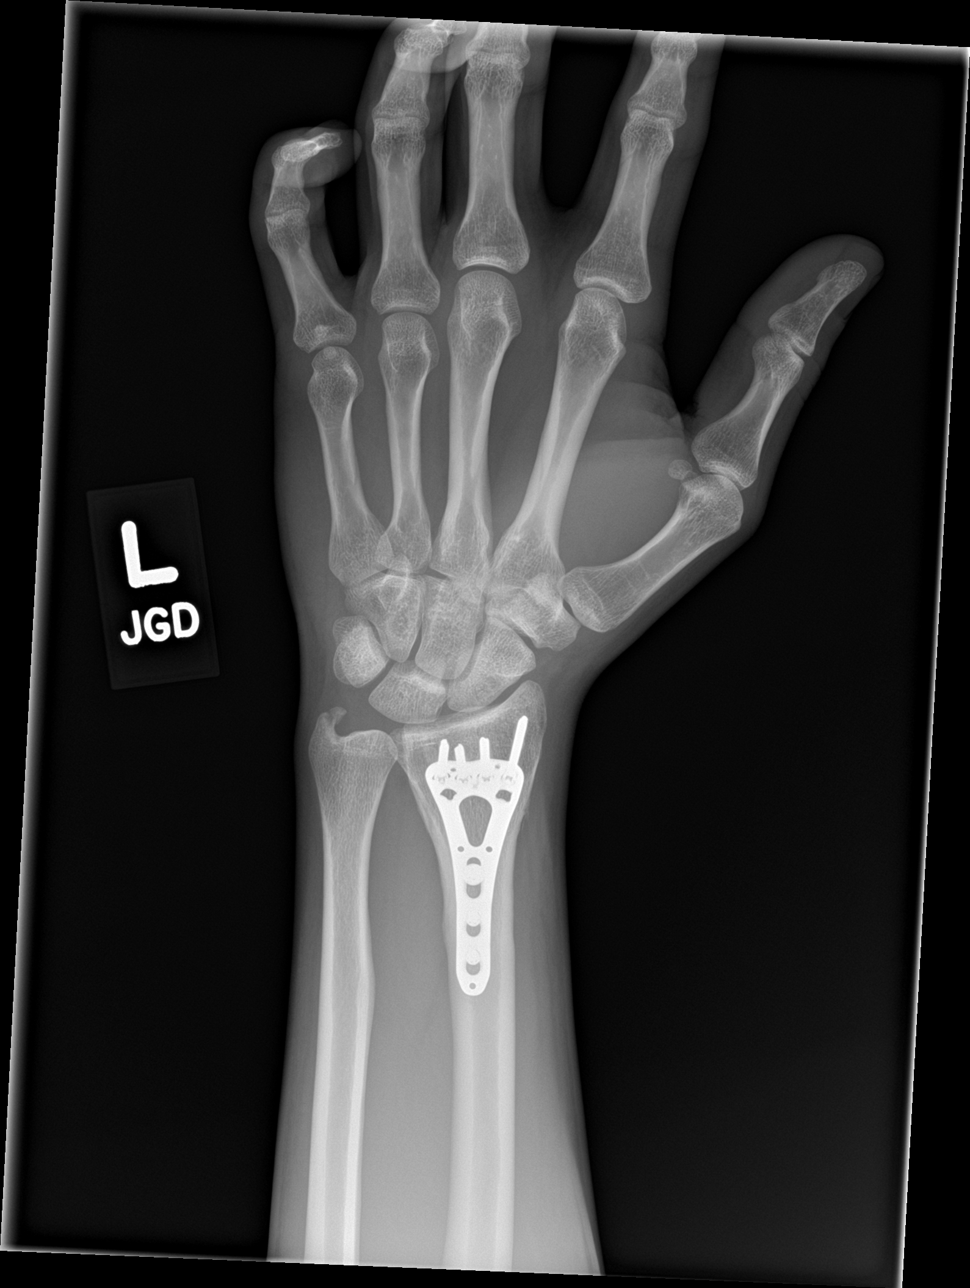

[wrist navicular]
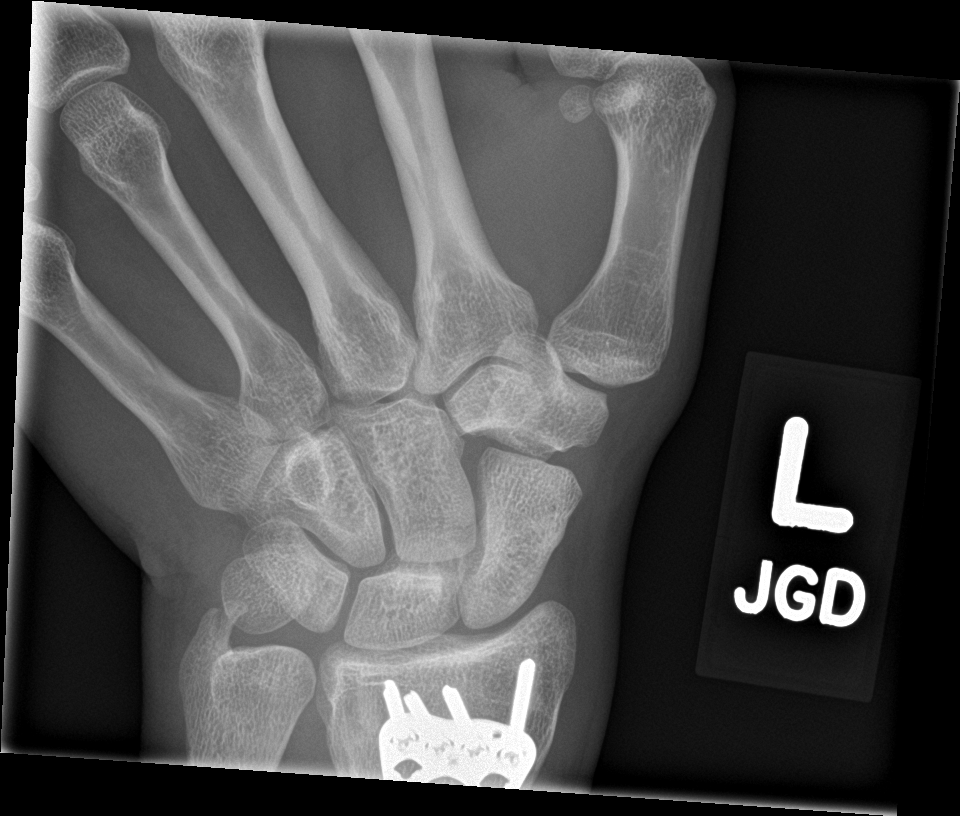

[wrist obl]
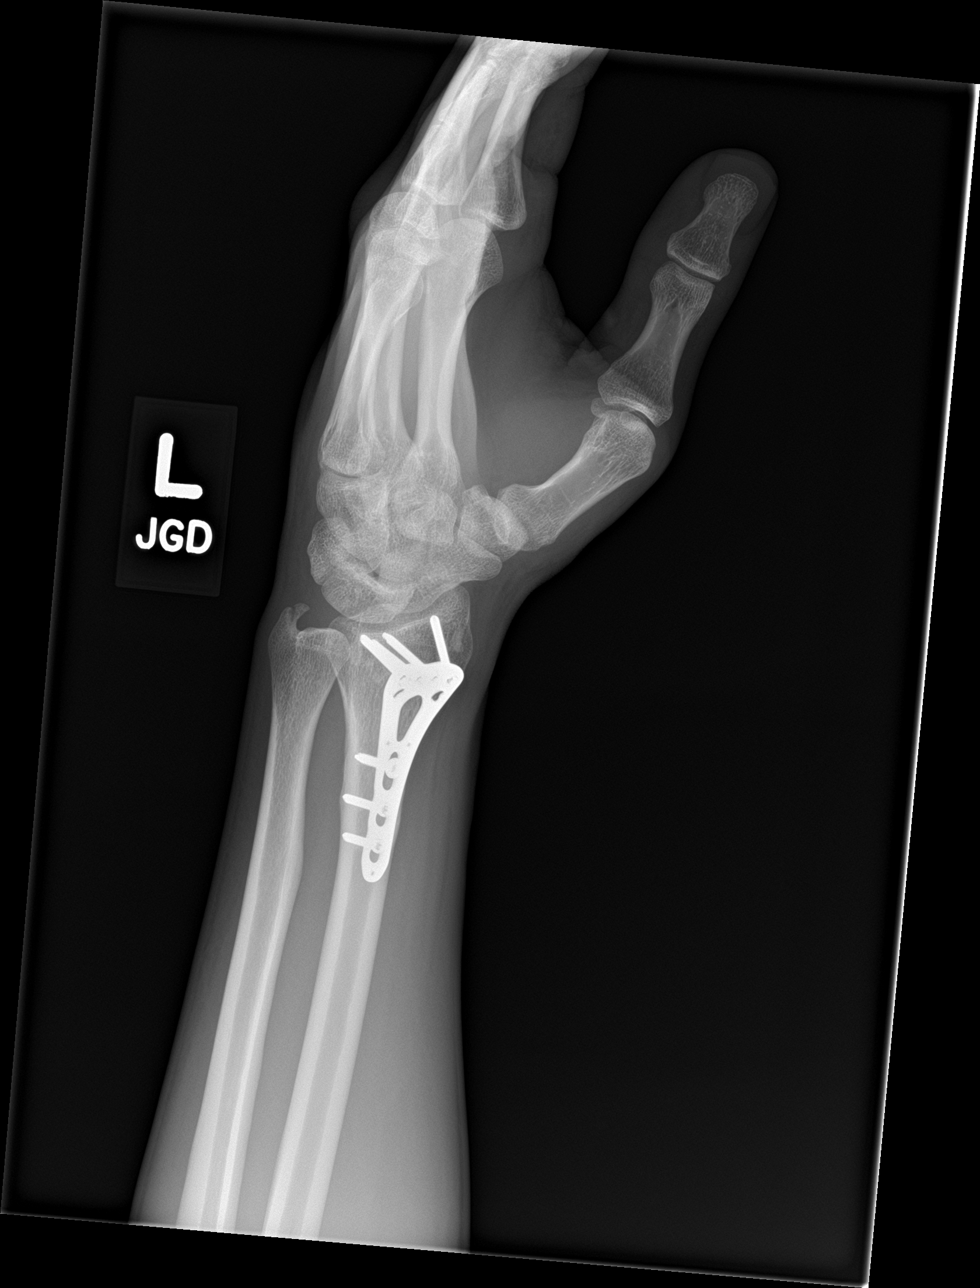

[wrist lat]
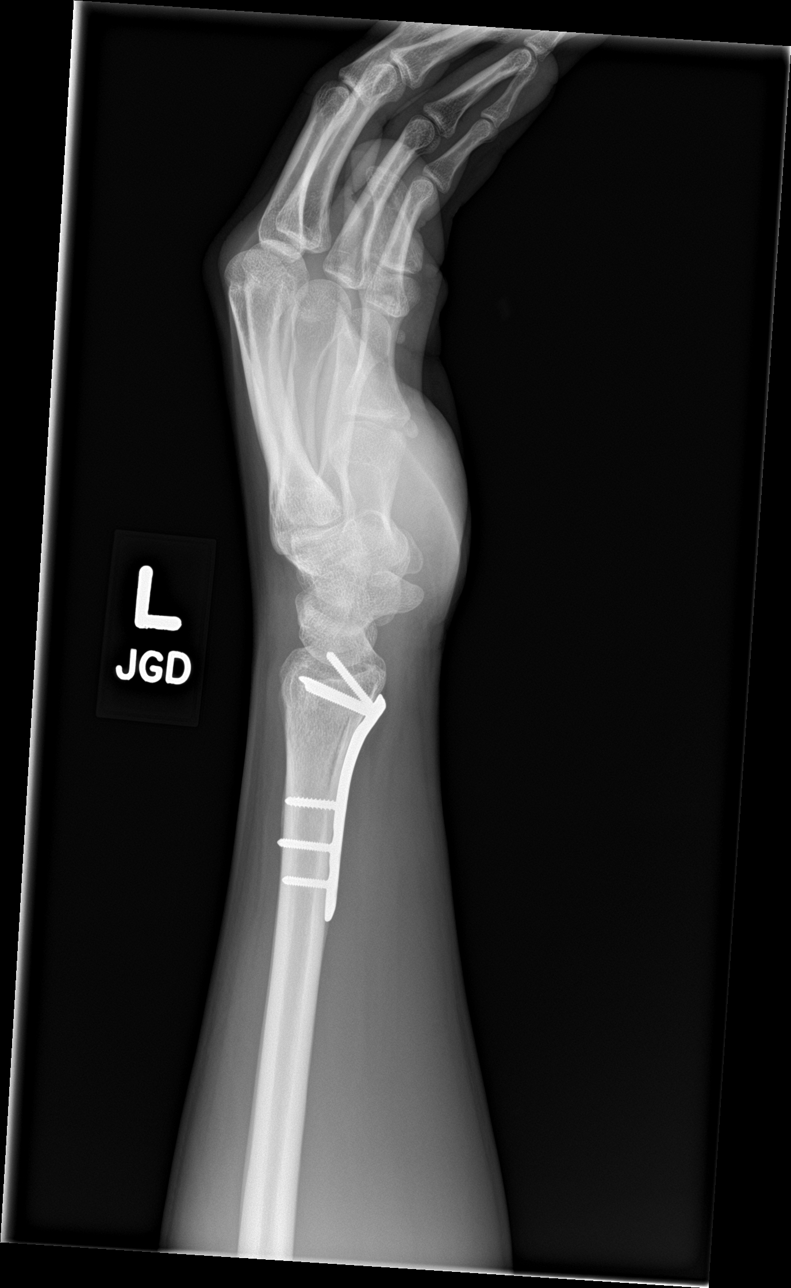

[4 of 4 positions shown; findings below may reference images not displayed]

FINDINGS: Frontal, oblique, lateral, and ulnar deviated views of the left
wrist are obtained. Previous volar plate and screw fixation of the
distal radius. There is a small acute fracture distal tip of the
ulnar styloid, which is minimally distracted. No other acute bony
abnormalities. Joint spaces are well preserved. Soft tissues are
normal.
IMPRESSION: 1. Small acute displaced ulnar styloid fracture.
2. Previous left radial ORIF.
